# Patient Record
Sex: Male | Born: 1995 | Race: Black or African American | Hispanic: No | Marital: Single | Smoking: Never smoker
Health system: Southern US, Community
[De-identification: ages and names within clinical notes are randomized; demographics above are authoritative.]

---
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
## 2017-04-13 ENCOUNTER — Encounter (HOSPITAL_COMMUNITY): Payer: Self-pay

## 2017-04-13 ENCOUNTER — Emergency Department (HOSPITAL_COMMUNITY)
Admission: EM | Admit: 2017-04-13 | Discharge: 2017-04-14 | Disposition: A | Discharge status: Home / Self Care | Payer: BC Managed Care – PPO | Attending: Emergency Medicine | Admitting: Emergency Medicine

## 2017-04-13 DIAGNOSIS — Z041 Encounter for examination and observation following transport accident: Secondary | ICD-10-CM | POA: Insufficient documentation
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
## 2017-04-14 ENCOUNTER — Emergency Department (HOSPITAL_COMMUNITY): Payer: BC Managed Care – PPO

## 2017-04-14 MED ORDER — ACETAMINOPHEN 500 MG PO TABS: 500.0000 mg | ORAL_TABLET | Freq: Four times a day (QID) | ORAL | 0 refills | Status: AC | PRN

## 2017-04-14 MED ORDER — METHOCARBAMOL 500 MG PO TABS: 500.0000 mg | ORAL_TABLET | Freq: Two times a day (BID) | ORAL | 0 refills | Status: AC

## 2017-04-14 MED ORDER — NAPROXEN 500 MG PO TABS
500.0000 mg | ORAL_TABLET | Freq: Two times a day (BID) | ORAL | 0 refills | Status: AC
Start: 2017-04-14 — End: ?
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
## 2017-04-14 NOTE — ED Notes (Signed)
See downtime paperwork for d/c 
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
